# Patient Record
Sex: Female | Born: 1975 | Race: Black or African American | Hispanic: No | Marital: Single | Smoking: Never smoker
Health system: Southern US, Community
[De-identification: ages and names within clinical notes are randomized; demographics above are authoritative.]

## PROBLEM LIST (undated history)

## (undated) DIAGNOSIS — I499 Cardiac arrhythmia, unspecified: Secondary | ICD-10-CM

## (undated) HISTORY — PX: CARDIAC SURGERY: SHX584

## (undated) HISTORY — PX: MITRAL VALVE SURGERY: SHX714

---

## 2018-09-16 ENCOUNTER — Other Ambulatory Visit: Payer: Self-pay

## 2018-09-16 ENCOUNTER — Emergency Department (HOSPITAL_COMMUNITY)
Admission: EM | Admit: 2018-09-16 | Discharge: 2018-09-16 | Disposition: A | Discharge status: Home / Self Care | Payer: Medicaid Other | Attending: Emergency Medicine | Admitting: Emergency Medicine

## 2018-09-16 ENCOUNTER — Encounter (HOSPITAL_COMMUNITY): Payer: Self-pay

## 2018-09-16 DIAGNOSIS — S6991XA Unspecified injury of right wrist, hand and finger(s), initial encounter: Secondary | ICD-10-CM | POA: Diagnosis present

## 2018-09-16 DIAGNOSIS — Y939 Activity, unspecified: Secondary | ICD-10-CM | POA: Diagnosis not present

## 2018-09-16 DIAGNOSIS — X500XXA Overexertion from strenuous movement or load, initial encounter: Secondary | ICD-10-CM | POA: Insufficient documentation

## 2018-09-16 DIAGNOSIS — Y929 Unspecified place or not applicable: Secondary | ICD-10-CM | POA: Insufficient documentation

## 2018-09-16 DIAGNOSIS — Y990 Civilian activity done for income or pay: Secondary | ICD-10-CM | POA: Insufficient documentation

## 2018-09-16 DIAGNOSIS — S63501A Unspecified sprain of right wrist, initial encounter: Secondary | ICD-10-CM | POA: Diagnosis not present

## 2018-09-16 NOTE — ED Notes (Signed)
Wrist splint applied to patient's right wrist/wrist forearm split.

## 2018-09-16 NOTE — Discharge Instructions (Signed)
Take Ibuprofen 600mg  three times daily for the next 5 days Wear wrist brace while awake to provide support Follow up with orthopedics if you are not improving (Dr. Romeo Apple)

## 2018-09-16 NOTE — ED Provider Notes (Signed)
[facility]ERGENCY DEPARTMENT Provider Note   CSN: [id number] Arrival date & time: 09/16/18  1953     History   Chief Complaint Chief Complaint  Patient presents with  . Wrist Pain    right    HPI Kathleen Clay is a 43 y.o. female who presents with right wrist pain. No significant PMH. She states that she works at [employer] and was doing a new position at her job that included doing a lot of lifting, turning, and twisting of heavy metal with her right wrist. She is RHD. She states that shortly after she started to have severe pain and throbbing in her wrist and in to her thumb. It also radiates to her shoulder. A manager was notified and she didn't want to leave work so they wrapped up her wrist with coban which helped her throbbing but didn't help a lot with the pain. She has tingling of the thumb. She doesn't have any weakness of the hand. She has never had this before. She took 800 Ibuprofen prior to arrival.   HPI  History reviewed. No pertinent past medical history.  There are no active problems to display for this patient.    The histories are not reviewed yet. Please review them in the "History" navigator section and refresh this SmartLink.   OB History   No obstetric history on file.      Home Medications    Prior to Admission medications   Not on File    Family History No family history on file.  Social History Social History   Tobacco Use  . Smoking status: Never Smoker  . Smokeless tobacco: Never Used  Substance Use Topics  . Alcohol use: Not Currently  . Drug use: Never     Allergies   Penicillins   Review of Systems Review of Systems  Musculoskeletal: Positive for arthralgias. Negative for joint swelling.  Neurological: Positive for numbness. Negative for weakness.     Physical Exam Updated Vital Signs BP (!) 144/82 (BP Location: Left Arm)   Pulse 62   Temp 98.3 F (36.8 C) (Tympanic)   Resp 18   Ht 6\' 3"  (1.905 m)   Wt 121.1 kg    LMP 08/24/2018 (Approximate)   SpO2 90%   BMI 33.37 kg/m   Physical Exam Vitals signs and nursing note reviewed.  Constitutional:      General: She is not in acute distress.    Appearance: Normal appearance. She is well-developed.  HENT:     Head: Normocephalic and atraumatic.  Eyes:     General: No scleral icterus.       Right eye: No discharge.        Left eye: No discharge.     Conjunctiva/sclera: Conjunctivae normal.     Pupils: Pupils are equal, round, and reactive to light.  Neck:     Musculoskeletal: Normal range of motion.  Cardiovascular:     Rate and Rhythm: Normal rate.  Pulmonary:     Effort: Pulmonary effort is normal. No respiratory distress.  Abdominal:     General: There is no distension.  Musculoskeletal:     Comments: Right upper extremity: Pt is guarding wrist. No obvious swelling, deformity, or warmth of the shoulder, elbow, or wrist. Tenderness to palpation over anterior tendons of wrist, mostly over the lateral aspect. She is also  Decreased ROM of wrist. 2+ radial pulse. Subjective tingling of the thumb   Skin:    General: Skin is warm and  dry.  Neurological:     Mental Status: She is alert and oriented to person, place, and time.  Psychiatric:        Behavior: Behavior normal.      ED Treatments / Results  Labs (all labs ordered are listed, but only abnormal results are displayed) Labs Reviewed - No data to display  EKG None  Radiology No results found.  Procedures Procedures (including critical care time)  Medications Ordered in ED Medications - No data to display   Initial Impression / Assessment and Plan / ED Course  I have reviewed the triage vital signs and the nursing notes.  Pertinent labs & imaging results that were available during my care of the patient were reviewed by me and considered in my medical decision making (see chart for details).  43 year old female with atraumatic wrist pain which radiates to her shoulder  and associated tingling of the thumb.  She is diffusely tender but mostly over the tendons.  Exam is consistent with overuse injury.  We had a discussion on whether or not to obtain imaging.  I advised against this since she did not have a traumatic injury.  She had first wanted imaging to "make sure nothing was wrong" however after explained her that we would only be looking for a fracture with an x-ray she understood and declined imaging.  Advised to rest and use a brace.  Also advised her to use scheduled ibuprofen and apply heat to the affected areas.  She was given a referral to orthopedics for follow-up.  She was given a work note.  Final Clinical Impressions(s) / ED Diagnoses   Final diagnoses:  Sprain of right wrist, initial encounter    ED Discharge Orders    None       Beryle Quant 09/16/18 2138    Eber Hong, MD 09/17/18 [phone]

## 2018-09-16 NOTE — ED Triage Notes (Signed)
Pt reports right wrist pain after lifting/twisting parts at work today. Pt reports taking 800 mg ibuprofen and that helped with throbbing, but shooting pains now with prickly feeling in fingers. Pt has wrist wrapped with Coban, able to fit fingerwidth under wrap. Cap refill WNL.

## 2019-06-12 ENCOUNTER — Observation Stay (HOSPITAL_COMMUNITY)
Admission: EM | Admit: 2019-06-12 | Discharge: 2019-06-13 | Disposition: A | Discharge status: Home / Self Care | Payer: Medicaid Other | Attending: Internal Medicine | Admitting: Internal Medicine

## 2019-06-12 ENCOUNTER — Encounter (HOSPITAL_COMMUNITY)
Admission: EM | Disposition: A | Discharge status: Home / Self Care | Payer: Self-pay | Source: Home / Self Care | Attending: Emergency Medicine

## 2019-06-12 ENCOUNTER — Emergency Department (HOSPITAL_COMMUNITY): Payer: Medicaid Other

## 2019-06-12 ENCOUNTER — Other Ambulatory Visit: Payer: Self-pay

## 2019-06-12 DIAGNOSIS — Z23 Encounter for immunization: Secondary | ICD-10-CM | POA: Diagnosis not present

## 2019-06-12 DIAGNOSIS — S0230XA Fracture of orbital floor, unspecified side, initial encounter for closed fracture: Secondary | ICD-10-CM

## 2019-06-12 DIAGNOSIS — Z880 Allergy status to penicillin: Secondary | ICD-10-CM | POA: Insufficient documentation

## 2019-06-12 DIAGNOSIS — S0240EA Zygomatic fracture, right side, initial encounter for closed fracture: Secondary | ICD-10-CM | POA: Insufficient documentation

## 2019-06-12 DIAGNOSIS — S0240CA Maxillary fracture, right side, initial encounter for closed fracture: Secondary | ICD-10-CM

## 2019-06-12 DIAGNOSIS — S0531XA Ocular laceration without prolapse or loss of intraocular tissue, right eye, initial encounter: Secondary | ICD-10-CM | POA: Diagnosis not present

## 2019-06-12 DIAGNOSIS — H5461 Unqualified visual loss, right eye, normal vision left eye: Secondary | ICD-10-CM | POA: Diagnosis not present

## 2019-06-12 DIAGNOSIS — S0590XA Unspecified injury of unspecified eye and orbit, initial encounter: Secondary | ICD-10-CM | POA: Diagnosis present

## 2019-06-12 DIAGNOSIS — D649 Anemia, unspecified: Secondary | ICD-10-CM | POA: Diagnosis present

## 2019-06-12 DIAGNOSIS — Y92410 Unspecified street and highway as the place of occurrence of the external cause: Secondary | ICD-10-CM | POA: Insufficient documentation

## 2019-06-12 DIAGNOSIS — Z952 Presence of prosthetic heart valve: Secondary | ICD-10-CM | POA: Insufficient documentation

## 2019-06-12 DIAGNOSIS — Z79899 Other long term (current) drug therapy: Secondary | ICD-10-CM | POA: Insufficient documentation

## 2019-06-12 DIAGNOSIS — S01411A Laceration without foreign body of right cheek and temporomandibular area, initial encounter: Secondary | ICD-10-CM | POA: Insufficient documentation

## 2019-06-12 DIAGNOSIS — D62 Acute posthemorrhagic anemia: Secondary | ICD-10-CM | POA: Insufficient documentation

## 2019-06-12 DIAGNOSIS — I4891 Unspecified atrial fibrillation: Secondary | ICD-10-CM | POA: Insufficient documentation

## 2019-06-12 DIAGNOSIS — S62309A Unspecified fracture of unspecified metacarpal bone, initial encounter for closed fracture: Secondary | ICD-10-CM

## 2019-06-12 DIAGNOSIS — Z20828 Contact with and (suspected) exposure to other viral communicable diseases: Secondary | ICD-10-CM | POA: Insufficient documentation

## 2019-06-12 DIAGNOSIS — D509 Iron deficiency anemia, unspecified: Secondary | ICD-10-CM | POA: Diagnosis present

## 2019-06-12 HISTORY — DX: Cardiac arrhythmia, unspecified: I49.9

## 2019-06-12 HISTORY — PX: RUPTURED GLOBE EXPLORATION AND REPAIR: SHX2366

## 2019-06-12 LAB — CBC WITH DIFFERENTIAL/PLATELET
Abs Immature Granulocytes: 0.04 10*3/uL (ref 0.00–0.07)
Basophils Absolute: 0.1 10*3/uL (ref 0.0–0.1)
Basophils Relative: 1 %
Eosinophils Absolute: 0.2 10*3/uL (ref 0.0–0.5)
Eosinophils Relative: 2 %
HCT: 22.3 % — ABNORMAL LOW (ref 36.0–46.0)
Hemoglobin: 6.1 g/dL — CL (ref 12.0–15.0)
Immature Granulocytes: 1 %
Lymphocytes Relative: 19 %
Lymphs Abs: 1.6 10*3/uL (ref 0.7–4.0)
MCH: 14.7 pg — ABNORMAL LOW (ref 26.0–34.0)
MCHC: 27.4 g/dL — ABNORMAL LOW (ref 30.0–36.0)
MCV: 53.9 fL — ABNORMAL LOW (ref 80.0–100.0)
Monocytes Absolute: 0.7 10*3/uL (ref 0.1–1.0)
Monocytes Relative: 8 %
Neutro Abs: 6 10*3/uL (ref 1.7–7.7)
Neutrophils Relative %: 69 %
Platelets: 251 10*3/uL (ref 150–400)
RBC: 4.14 MIL/uL (ref 3.87–5.11)
RDW: 23.3 % — ABNORMAL HIGH (ref 11.5–15.5)
WBC: 8.6 10*3/uL (ref 4.0–10.5)
nRBC: 0 % (ref 0.0–0.2)

## 2019-06-12 LAB — BASIC METABOLIC PANEL
Anion gap: 12 (ref 5–15)
BUN: 11 mg/dL (ref 6–20)
CO2: 22 mmol/L (ref 22–32)
Calcium: 8.6 mg/dL — ABNORMAL LOW (ref 8.9–10.3)
Chloride: 103 mmol/L (ref 98–111)
Creatinine, Ser: 0.84 mg/dL (ref 0.44–1.00)
GFR calc Af Amer: >60 mL/min (ref >60)
GFR calc non Af Amer: >60 mL/min (ref >60)
Glucose, Bld: 127 mg/dL — ABNORMAL HIGH (ref 70–99)
Potassium: 3.9 mmol/L (ref 3.5–5.1)
Sodium: 137 mmol/L (ref 135–145)

## 2019-06-12 LAB — PROTIME-INR
INR: 1.3 — ABNORMAL HIGH (ref 0.8–1.2)
Prothrombin Time: 16.4 seconds — ABNORMAL HIGH (ref 11.4–15.2)

## 2019-06-12 LAB — I-STAT CHEM 8, ED
BUN: 14 mg/dL (ref 6–20)
Calcium, Ion: 1.1 mmol/L — ABNORMAL LOW (ref 1.15–1.40)
Chloride: 103 mmol/L (ref 98–111)
Creatinine, Ser: 0.7 mg/dL (ref 0.44–1.00)
Glucose, Bld: 125 mg/dL — ABNORMAL HIGH (ref 70–99)
HCT: 27 % — ABNORMAL LOW (ref 36.0–46.0)
Hemoglobin: 9.2 g/dL — ABNORMAL LOW (ref 12.0–15.0)
Potassium: 4 mmol/L (ref 3.5–5.1)
Sodium: 139 mmol/L (ref 135–145)
TCO2: 25 mmol/L (ref 22–32)

## 2019-06-12 LAB — CBC
HCT: 22 % — ABNORMAL LOW (ref 36.0–46.0)
Hemoglobin: 6.1 g/dL — CL (ref 12.0–15.0)
MCH: 14.8 pg — ABNORMAL LOW (ref 26.0–34.0)
MCHC: 27.7 g/dL — ABNORMAL LOW (ref 30.0–36.0)
MCV: 53.3 fL — ABNORMAL LOW (ref 80.0–100.0)
Platelets: 241 10*3/uL (ref 150–400)
RBC: 4.13 MIL/uL (ref 3.87–5.11)
RDW: 23.2 % — ABNORMAL HIGH (ref 11.5–15.5)
WBC: 8.5 10*3/uL (ref 4.0–10.5)
nRBC: 0 % (ref 0.0–0.2)

## 2019-06-12 LAB — I-STAT BETA HCG BLOOD, ED (MC, WL, AP ONLY): I-stat hCG, quantitative: <5 m[IU]/mL (ref <5)

## 2019-06-12 LAB — ETHANOL: Alcohol, Ethyl (B): <10 mg/dL (ref <10)

## 2019-06-12 SURGERY — REPAIR, RUPTURE, GLOBE
Anesthesia: General | Site: Eye | Laterality: Right

## 2019-06-12 MED ORDER — CEFAZOLIN SODIUM-DEXTROSE 1-4 GM/50ML-% IV SOLN
1.0000 g | Freq: Once | INTRAVENOUS | Status: AC
  Administered 2019-06-12: 23:00:00 1 g via INTRAVENOUS
  Filled 2019-06-12: qty 50

## 2019-06-12 MED ORDER — FENTANYL CITRATE (PF) 250 MCG/5ML IJ SOLN
INTRAMUSCULAR | Status: AC
  Filled 2019-06-12: qty 5

## 2019-06-12 MED ORDER — PROPOFOL 10 MG/ML IV BOLUS
INTRAVENOUS | Status: AC
  Filled 2019-06-12: qty 20

## 2019-06-12 MED ORDER — LIDOCAINE 2% (20 MG/ML) 5 ML SYRINGE
INTRAMUSCULAR | Status: AC
  Filled 2019-06-12: qty 5

## 2019-06-12 MED ORDER — SUCCINYLCHOLINE CHLORIDE 200 MG/10ML IV SOSY
PREFILLED_SYRINGE | INTRAVENOUS | Status: AC
  Filled 2019-06-12: qty 10

## 2019-06-12 MED ORDER — MORPHINE SULFATE (PF) 4 MG/ML IV SOLN
4.0000 mg | Freq: Once | INTRAVENOUS | Status: AC
  Administered 2019-06-12: 23:00:00 4 mg via INTRAVENOUS
  Filled 2019-06-12: qty 1

## 2019-06-12 MED ORDER — NA CHONDROIT SULF-NA HYALURON 40-30 MG/ML IO SOLN
INTRAOCULAR | Status: AC
  Filled 2019-06-12: qty 0.5

## 2019-06-12 MED ORDER — ROCURONIUM BROMIDE 10 MG/ML (PF) SYRINGE
PREFILLED_SYRINGE | INTRAVENOUS | Status: AC
  Filled 2019-06-12: qty 10

## 2019-06-12 MED ORDER — SODIUM HYALURONATE 10 MG/ML IO SOLN
INTRAOCULAR | Status: AC
  Filled 2019-06-12: qty 0.85

## 2019-06-12 MED ORDER — MIDAZOLAM HCL 2 MG/2ML IJ SOLN
INTRAMUSCULAR | Status: AC
  Filled 2019-06-12: qty 2

## 2019-06-12 MED ORDER — TETANUS-DIPHTH-ACELL PERTUSSIS 5-2.5-18.5 LF-MCG/0.5 IM SUSP
0.5000 mL | Freq: Once | INTRAMUSCULAR | Status: AC
  Administered 2019-06-12: 23:00:00 0.5 mL via INTRAMUSCULAR
  Filled 2019-06-12: qty 0.5

## 2019-06-12 MED ORDER — BSS IO SOLN
INTRAOCULAR | Status: AC
  Filled 2019-06-12: qty 30

## 2019-06-12 SURGICAL SUPPLY — 37 items
APPLICATOR COTTON TIP 6 STRL (MISCELLANEOUS) ×1 IMPLANT
APPLICATOR COTTON TIP 6IN STRL (MISCELLANEOUS) ×3
APPLICATOR DR MATTHEWS STRL (MISCELLANEOUS) ×3 IMPLANT
BLADE KERATOME 2.75 (BLADE) IMPLANT
BLADE KERATOME 2.75MM (BLADE)
BLADE SUPER 15 ALCON (BLADE) ×3 IMPLANT
CANNULA ANTERIOR CHAMBER 27GA (MISCELLANEOUS) IMPLANT
CAUTERY EYE LOW TEMP 1300F FIN (OPHTHALMIC RELATED) IMPLANT
CORD BIPOLAR FORCEPS 12FT (ELECTRODE) IMPLANT
COVER MAYO STAND STRL (DRAPES) ×3 IMPLANT
COVER SURGICAL LIGHT HANDLE (MISCELLANEOUS) ×3 IMPLANT
COVER WAND RF STERILE (DRAPES) IMPLANT
DRAPE OPHTHALMIC 40X48 W POUCH (DRAPES) ×3 IMPLANT
DRAPE RETRACTOR (MISCELLANEOUS) IMPLANT
FRAME EYE SHIELD (PROTECTIVE WEAR) ×3 IMPLANT
GLOVE BIO SURGEON STRL SZ7.5 (GLOVE) ×6 IMPLANT
GOWN STRL REUS W/ TWL LRG LVL3 (GOWN DISPOSABLE) ×2 IMPLANT
GOWN STRL REUS W/TWL LRG LVL3 (GOWN DISPOSABLE) ×4
KIT BASIN OR (CUSTOM PROCEDURE TRAY) ×3 IMPLANT
NEEDLE 18GX1X1/2 (RX/OR ONLY) (NEEDLE) ×3 IMPLANT
NEEDLE 25GX 5/8IN NON SAFETY (NEEDLE) ×3 IMPLANT
NEEDLE FILTER BLUNT 18X 1/2SAF (NEEDLE) ×2
NEEDLE FILTER BLUNT 18X1 1/2 (NEEDLE) ×1 IMPLANT
NS IRRIG 1000ML POUR BTL (IV SOLUTION) ×3 IMPLANT
PACK CATARACT CUSTOM (CUSTOM PROCEDURE TRAY) ×3 IMPLANT
PAD ARMBOARD 7.5X6 YLW CONV (MISCELLANEOUS) ×3 IMPLANT
PAK PIK CVS CATARACT (OPHTHALMIC) ×3 IMPLANT
SHIELD EYE LENSE ONLY DISP (GAUZE/BANDAGES/DRESSINGS) ×3 IMPLANT
SUT CHROMIC 6 0 TG140 8 (SUTURE) ×3 IMPLANT
SUT ETHILON 10 0 CS140 6 (SUTURE) IMPLANT
SUT ETHILON 8 0 TG100 8 (SUTURE) ×3 IMPLANT
SUT SILK 6 0 G 6 (SUTURE) IMPLANT
SUT VICRYL 8 0 TG140 8 (SUTURE) ×3 IMPLANT
SYR TB 1ML LUER SLIP (SYRINGE) ×3 IMPLANT
TIP ABS 45DEG FLARED 0.9MM (TIP) IMPLANT
WATER STERILE IRR 1000ML POUR (IV SOLUTION) ×3 IMPLANT
WIPE INSTRUMENT VISIWIPE 73X73 (MISCELLANEOUS) IMPLANT
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
## 2019-06-12 NOTE — ED Notes (Signed)
X-ray at bedside

## 2019-06-12 NOTE — ED Notes (Signed)
Called and spoke with pt's daughter about pt's surgery and condition. Answered all questions. Told her we will call her after pt's surgery is finished and give her an update
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
## 2019-06-12 NOTE — ED Notes (Signed)
Pt transported to CT ?

## 2019-06-12 NOTE — Op Note (Signed)
OPHTHALMOLOGY OPERATIVE NOTE  SURGEON: Julian Reil, M.D.   OPERATIVE EYE: Right eye  PRE OP DIAGNOSIS: Ruptured globe, right eye  POST OP DIAGNOSIS: SAME  PROCEDURE: Repair of ruptured globe, right eye  ANESTHESIA: General anesthesia  Blood loss: <5cc.   COMPLICATIONS:  NONE  DESCRIPTION OF PROCEDURE:  The patient was seen in the preoperative area where the surgical eye was marked. Review of procedure and poor prognosis discussed. Consent for repair of ruptured globe was obtained. The patient was brought back to the operating room. The eye was prepped and draped in the usual sterile fashion for surgery. The Surgeon sat superiorly. A lid speculum was placed.A 360 degree peritomy was performed. Uveal contents and what appeared to be the lens extruded through the wound. The eye was explored.  A large rupture was noted starting at 9 oclock and extending posteriorly. The lateral rectus and superior rectus could not be identified on the globe. They were assumed to be avulsed. The medial and inferior rectus were identified. Attempts were made to isolate the scleral laceration. An 8-0 nylon suture was used to partially close the scleral laceration in an interrupted fashion. However, the scleral laceration extended to far posteriorly for safe visualization. Due to the extent of the injuries and extrusion of intraocular contents, I decided to not further close the sclera. The conjunctiva was then sutured closed with an 8-0 vicryl. A subtenon injection of lidocaine with bupivicane was given. A subtenon injection of triamcinolone was given, approximately 40 mg.  The lid speculum was removed.Attention was turned to the cheek laceration. A 6-0 chromic suture was used to close the skin in running fashion. Good closure was achieved. The drapes were removed and the procedure was terminated. A patch was placed over the operative eye. The patient was extubated and taken to the PACU in good condition.

## 2019-06-12 NOTE — ED Notes (Signed)
Pt transported to xray 
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
## 2019-06-13 ENCOUNTER — Encounter (HOSPITAL_COMMUNITY): Payer: Self-pay | Admitting: Internal Medicine

## 2019-06-13 ENCOUNTER — Emergency Department (HOSPITAL_COMMUNITY): Payer: Medicaid Other | Admitting: Anesthesiology

## 2019-06-13 DIAGNOSIS — S0240EA Zygomatic fracture, right side, initial encounter for closed fracture: Secondary | ICD-10-CM | POA: Diagnosis not present

## 2019-06-13 DIAGNOSIS — S0240CB Maxillary fracture, right side, initial encounter for open fracture: Secondary | ICD-10-CM

## 2019-06-13 DIAGNOSIS — D649 Anemia, unspecified: Secondary | ICD-10-CM | POA: Diagnosis present

## 2019-06-13 DIAGNOSIS — D509 Iron deficiency anemia, unspecified: Secondary | ICD-10-CM | POA: Diagnosis not present

## 2019-06-13 DIAGNOSIS — S0531XA Ocular laceration without prolapse or loss of intraocular tissue, right eye, initial encounter: Secondary | ICD-10-CM | POA: Diagnosis not present

## 2019-06-13 DIAGNOSIS — S62309A Unspecified fracture of unspecified metacarpal bone, initial encounter for closed fracture: Secondary | ICD-10-CM

## 2019-06-13 DIAGNOSIS — S0240CA Maxillary fracture, right side, initial encounter for closed fracture: Secondary | ICD-10-CM

## 2019-06-13 DIAGNOSIS — Z952 Presence of prosthetic heart valve: Secondary | ICD-10-CM

## 2019-06-13 DIAGNOSIS — I4891 Unspecified atrial fibrillation: Secondary | ICD-10-CM

## 2019-06-13 DIAGNOSIS — H5461 Unqualified visual loss, right eye, normal vision left eye: Secondary | ICD-10-CM | POA: Diagnosis not present

## 2019-06-13 DIAGNOSIS — S0230XA Fracture of orbital floor, unspecified side, initial encounter for closed fracture: Secondary | ICD-10-CM

## 2019-06-13 LAB — CBC WITH DIFFERENTIAL/PLATELET
Abs Immature Granulocytes: 0.07 10*3/uL (ref 0.00–0.07)
Basophils Absolute: 0 10*3/uL (ref 0.0–0.1)
Basophils Relative: 0 %
Eosinophils Absolute: 0 10*3/uL (ref 0.0–0.5)
Eosinophils Relative: 0 %
HCT: 24.7 % — ABNORMAL LOW (ref 36.0–46.0)
Hemoglobin: 7.1 g/dL — ABNORMAL LOW (ref 12.0–15.0)
Immature Granulocytes: 1 %
Lymphocytes Relative: 3 %
Lymphs Abs: 0.4 10*3/uL — ABNORMAL LOW (ref 0.7–4.0)
MCH: 16.1 pg — ABNORMAL LOW (ref 26.0–34.0)
MCHC: 28.7 g/dL — ABNORMAL LOW (ref 30.0–36.0)
MCV: 56 fL — ABNORMAL LOW (ref 80.0–100.0)
Monocytes Absolute: 0.2 10*3/uL (ref 0.1–1.0)
Monocytes Relative: 2 %
Neutro Abs: 11.7 10*3/uL — ABNORMAL HIGH (ref 1.7–7.7)
Neutrophils Relative %: 94 %
Platelets: 252 10*3/uL (ref 150–400)
RBC: 4.41 MIL/uL (ref 3.87–5.11)
RDW: 26.7 % — ABNORMAL HIGH (ref 11.5–15.5)
WBC: 12.3 10*3/uL — ABNORMAL HIGH (ref 4.0–10.5)
nRBC: 0.2 % (ref 0.0–0.2)

## 2019-06-13 LAB — SARS CORONAVIRUS 2 BY RT PCR (HOSPITAL ORDER, PERFORMED IN ~~LOC~~ HOSPITAL LAB): SARS Coronavirus 2: NEGATIVE

## 2019-06-13 LAB — BASIC METABOLIC PANEL
Anion gap: 11 (ref 5–15)
BUN: 11 mg/dL (ref 6–20)
CO2: 23 mmol/L (ref 22–32)
Calcium: 9.1 mg/dL (ref 8.9–10.3)
Chloride: 101 mmol/L (ref 98–111)
Creatinine, Ser: 0.84 mg/dL (ref 0.44–1.00)
GFR calc Af Amer: >60 mL/min (ref >60)
GFR calc non Af Amer: >60 mL/min (ref >60)
Glucose, Bld: 144 mg/dL — ABNORMAL HIGH (ref 70–99)
Potassium: 4.8 mmol/L (ref 3.5–5.1)
Sodium: 135 mmol/L (ref 135–145)

## 2019-06-13 LAB — POCT I-STAT 4, (NA,K, GLUC, HGB,HCT)
Glucose, Bld: 135 mg/dL — ABNORMAL HIGH (ref 70–99)
HCT: 25 % — ABNORMAL LOW (ref 36.0–46.0)
Hemoglobin: 8.5 g/dL — ABNORMAL LOW (ref 12.0–15.0)
Potassium: 4 mmol/L (ref 3.5–5.1)
Sodium: 138 mmol/L (ref 135–145)

## 2019-06-13 LAB — ABO/RH: ABO/RH(D): A POS

## 2019-06-13 LAB — HIV ANTIBODY (ROUTINE TESTING W REFLEX): HIV Screen 4th Generation wRfx: NONREACTIVE

## 2019-06-13 LAB — PREPARE RBC (CROSSMATCH)

## 2019-06-13 MED ORDER — ONDANSETRON HCL 4 MG/2ML IJ SOLN: 4.0000 mg | Freq: Four times a day (QID) | INTRAMUSCULAR | Status: DC | PRN

## 2019-06-13 MED ORDER — METOPROLOL TARTRATE 12.5 MG HALF TABLET
12.5000 mg | ORAL_TABLET | Freq: Every day | ORAL | Status: DC
  Administered 2019-06-13: 09:00:00 12.5 mg via ORAL
  Filled 2019-06-13: qty 1

## 2019-06-13 MED ORDER — 0.9 % SODIUM CHLORIDE (POUR BTL) OPTIME
TOPICAL | Status: DC | PRN
  Administered 2019-06-13: 1000 mL

## 2019-06-13 MED ORDER — IBUPROFEN 200 MG PO TABS
400.0000 mg | ORAL_TABLET | Freq: Once | ORAL | Status: AC
  Administered 2019-06-13: 10:00:00 400 mg via ORAL
  Filled 2019-06-13: qty 2

## 2019-06-13 MED ORDER — OXYCODONE HCL 5 MG PO TABS: 5.0000 mg | ORAL_TABLET | Freq: Once | ORAL | Status: DC | PRN

## 2019-06-13 MED ORDER — LIDOCAINE HCL (PF) 4 % IJ SOLN
INTRAMUSCULAR | Status: AC
  Filled 2019-06-13: qty 5

## 2019-06-13 MED ORDER — PROVISC 10 MG/ML IO SOLN
INTRAOCULAR | Status: DC | PRN
  Administered 2019-06-13: 0.85 mL via INTRAOCULAR

## 2019-06-13 MED ORDER — EPHEDRINE 5 MG/ML INJ
INTRAVENOUS | Status: AC
  Filled 2019-06-13: qty 10

## 2019-06-13 MED ORDER — BUPIVACAINE HCL (PF) 0.75 % IJ SOLN
INTRAMUSCULAR | Status: AC
  Filled 2019-06-13: qty 10

## 2019-06-13 MED ORDER — BSS IO SOLN
INTRAOCULAR | Status: DC | PRN
  Administered 2019-06-13: 15 mL via INTRAOCULAR

## 2019-06-13 MED ORDER — TOBRAMYCIN 0.3 % OP OINT
TOPICAL_OINTMENT | OPHTHALMIC | Status: DC | PRN
  Administered 2019-06-13: 1 via OPHTHALMIC

## 2019-06-13 MED ORDER — EPHEDRINE SULFATE 50 MG/ML IJ SOLN
INTRAMUSCULAR | Status: DC | PRN
  Administered 2019-06-13 (×2): 10 mg via INTRAVENOUS

## 2019-06-13 MED ORDER — NA CHONDROIT SULF-NA HYALURON 40-30 MG/ML IO SOLN
INTRAOCULAR | Status: DC | PRN
  Administered 2019-06-13: 0.5 mL via INTRAOCULAR

## 2019-06-13 MED ORDER — OXYCODONE HCL 5 MG/5ML PO SOLN: 5.0000 mg | Freq: Once | ORAL | Status: DC | PRN

## 2019-06-13 MED ORDER — PROPOFOL 10 MG/ML IV BOLUS
INTRAVENOUS | Status: DC | PRN
  Administered 2019-06-13: 200 mg via INTRAVENOUS

## 2019-06-13 MED ORDER — OXYCODONE HCL 5 MG PO TABS
5.0000 mg | ORAL_TABLET | Freq: Once | ORAL | Status: AC
  Administered 2019-06-13: 10:00:00 5 mg via ORAL
  Filled 2019-06-13: qty 1

## 2019-06-13 MED ORDER — FERROUS GLUCONATE 324 (38 FE) MG PO TABS: 324.0000 mg | ORAL_TABLET | Freq: Every day | ORAL | 3 refills | Status: AC

## 2019-06-13 MED ORDER — LIDOCAINE HCL (CARDIAC) PF 100 MG/5ML IV SOSY
PREFILLED_SYRINGE | INTRAVENOUS | Status: DC | PRN
  Administered 2019-06-13: 100 mg via INTRATRACHEAL

## 2019-06-13 MED ORDER — IBUPROFEN 200 MG PO TABS: 400.0000 mg | ORAL_TABLET | Freq: Four times a day (QID) | ORAL | 2 refills | Status: AC | PRN

## 2019-06-13 MED ORDER — SODIUM CHLORIDE 0.9 % IV SOLN: 10.0000 mL/h | Freq: Once | INTRAVENOUS | Status: DC

## 2019-06-13 MED ORDER — SUGAMMADEX SODIUM 500 MG/5ML IV SOLN
INTRAVENOUS | Status: DC | PRN
  Administered 2019-06-13: 300 mg via INTRAVENOUS

## 2019-06-13 MED ORDER — MORPHINE SULFATE (PF) 4 MG/ML IV SOLN: 4.0000 mg | Freq: Once | INTRAVENOUS | Status: DC

## 2019-06-13 MED ORDER — SENNOSIDES-DOCUSATE SODIUM 8.6-50 MG PO TABS: 2.0000 | ORAL_TABLET | Freq: Every day | ORAL | 1 refills | Status: AC | PRN

## 2019-06-13 MED ORDER — ATROPINE SULFATE 1 % OP SOLN
OPHTHALMIC | Status: AC
  Filled 2019-06-13: qty 5

## 2019-06-13 MED ORDER — ROCURONIUM 10MG/ML (10ML) SYRINGE FOR MEDFUSION PUMP - OPTIME
INTRAVENOUS | Status: DC | PRN
  Administered 2019-06-13: 100 mg via INTRAVENOUS

## 2019-06-13 MED ORDER — TOBRAMYCIN-DEXAMETHASONE 0.3-0.1 % OP OINT
TOPICAL_OINTMENT | OPHTHALMIC | Status: AC
  Filled 2019-06-13: qty 3.5

## 2019-06-13 MED ORDER — LIDOCAINE HCL (PF) 4 % IJ SOLN
INTRAMUSCULAR | Status: DC | PRN
  Administered 2019-06-13: 7 mL via OPHTHALMIC

## 2019-06-13 MED ORDER — POLYETHYLENE GLYCOL 3350 17 G PO PACK: 17.0000 g | PACK | Freq: Every day | ORAL | 0 refills | Status: AC

## 2019-06-13 MED ORDER — ACETAMINOPHEN 650 MG RE SUPP: 650.0000 mg | Freq: Four times a day (QID) | RECTAL | Status: DC | PRN

## 2019-06-13 MED ORDER — SUGAMMADEX SODIUM 500 MG/5ML IV SOLN
INTRAVENOUS | Status: AC
  Filled 2019-06-13: qty 5

## 2019-06-13 MED ORDER — BISACODYL 10 MG RE SUPP: 10.0000 mg | RECTAL | 0 refills | Status: AC | PRN

## 2019-06-13 MED ORDER — MORPHINE SULFATE (PF) 2 MG/ML IV SOLN
1.0000 mg | INTRAVENOUS | Status: DC | PRN
  Administered 2019-06-13 (×2): 1 mg via INTRAVENOUS
  Filled 2019-06-13 (×2): qty 1

## 2019-06-13 MED ORDER — DULCOLAX 5 MG PO TBEC: 5.0000 mg | DELAYED_RELEASE_TABLET | Freq: Every day | ORAL | 1 refills | Status: AC | PRN

## 2019-06-13 MED ORDER — PROMETHAZINE HCL 25 MG/ML IJ SOLN: 6.2500 mg | INTRAMUSCULAR | Status: DC | PRN

## 2019-06-13 MED ORDER — ONDANSETRON HCL 4 MG PO TABS: 4.0000 mg | ORAL_TABLET | Freq: Four times a day (QID) | ORAL | Status: DC | PRN

## 2019-06-13 MED ORDER — DEXAMETHASONE SODIUM PHOSPHATE 10 MG/ML IJ SOLN
INTRAMUSCULAR | Status: DC | PRN
  Administered 2019-06-13: 10 mg via INTRAVENOUS

## 2019-06-13 MED ORDER — LACTATED RINGERS IV SOLN
INTRAVENOUS | Status: DC | PRN
  Administered 2019-06-13: 01:00:00 via INTRAVENOUS

## 2019-06-13 MED ORDER — OXYCODONE HCL 5 MG PO TABS: 5.0000 mg | ORAL_TABLET | ORAL | 0 refills | Status: AC | PRN

## 2019-06-13 MED ORDER — MIDAZOLAM HCL 2 MG/2ML IJ SOLN
INTRAMUSCULAR | Status: DC | PRN
  Administered 2019-06-13: 2 mg via INTRAVENOUS

## 2019-06-13 MED ORDER — TRIAMCINOLONE ACETONIDE 40 MG/ML IJ SUSP
INTRAMUSCULAR | Status: DC | PRN
  Administered 2019-06-13: 40 mg via INTRAMUSCULAR

## 2019-06-13 MED ORDER — FENTANYL CITRATE (PF) 250 MCG/5ML IJ SOLN
INTRAMUSCULAR | Status: DC | PRN
  Administered 2019-06-13: 150 ug via INTRAVENOUS
  Administered 2019-06-13: 50 ug via INTRAVENOUS

## 2019-06-13 MED ORDER — ACETAMINOPHEN 10 MG/ML IV SOLN
1000.0000 mg | Freq: Once | INTRAVENOUS | Status: DC | PRN
  Administered 2019-06-13: 04:00:00 1000 mg via INTRAVENOUS

## 2019-06-13 MED ORDER — ONDANSETRON HCL 4 MG/2ML IJ SOLN
INTRAMUSCULAR | Status: DC | PRN
  Administered 2019-06-13: 4 mg via INTRAVENOUS

## 2019-06-13 MED ORDER — ACETAMINOPHEN 10 MG/ML IV SOLN
INTRAVENOUS | Status: AC
  Filled 2019-06-13: qty 100

## 2019-06-13 MED ORDER — NALOXONE HCL 0.4 MG/ML IJ SOLN
INTRAMUSCULAR | Status: AC
  Filled 2019-06-13: qty 1

## 2019-06-13 MED ORDER — SODIUM CHLORIDE 0.9 % IV SOLN
INTRAVENOUS | Status: DC | PRN
  Administered 2019-06-13: 02:00:00 via INTRAVENOUS

## 2019-06-13 MED ORDER — POLYETHYLENE GLYCOL 3350 17 G PO PACK
17.0000 g | PACK | Freq: Every day | ORAL | Status: DC
  Administered 2019-06-13: 10:00:00 17 g via ORAL
  Filled 2019-06-13: qty 1

## 2019-06-13 MED ORDER — FENTANYL CITRATE (PF) 100 MCG/2ML IJ SOLN: 25.0000 ug | INTRAMUSCULAR | Status: DC | PRN

## 2019-06-13 MED ORDER — NALOXONE HCL 0.4 MG/ML IJ SOLN
INTRAMUSCULAR | Status: DC | PRN
  Administered 2019-06-13 (×2): 20 ug via INTRAVENOUS

## 2019-06-13 MED ORDER — ACETAMINOPHEN 325 MG PO TABS
650.0000 mg | ORAL_TABLET | Freq: Four times a day (QID) | ORAL | Status: DC | PRN
  Administered 2019-06-13: 650 mg via ORAL
  Filled 2019-06-13 (×2): qty 2

## 2019-06-13 NOTE — Progress Notes (Signed)
Orthopedic Tech Progress Note Patient Details:  Kathleen Clay 1975/12/26 [id number]  Ortho Devices Type of Ortho Device: Ulna gutter splint Ortho Device/Splint Location: URE Ortho Device/Splint Interventions: Adjustment, Application, Ordered   Post Interventions Patient Tolerated: Well Instructions Provided: Care of device, Adjustment of device   [name] 06/13/2019, 11:51 AM
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
## 2019-06-13 NOTE — Anesthesia Preprocedure Evaluation (Addendum)
Anesthesia Evaluation  Patient identified by MRN, date of birth, ID band Patient awake    Reviewed: Allergy & Precautions, NPO status , Patient's Chart, lab work & pertinent test results, reviewed documented beta blocker date and time   History of Anesthesia Complications Negative for: history of anesthetic complications  Airway Mallampati: II  TM Distance: >3 FB Neck ROM: Full    Dental  (+) Dental Advisory Given, Teeth Intact   Pulmonary neg pulmonary ROS,    Pulmonary exam normal        Cardiovascular Normal cardiovascular exam+ dysrhythmias Atrial Fibrillation    S/p mitral valve replacement surgery for hx rheumatic fever     Neuro/Psych negative neurological ROS  negative psych ROS   GI/Hepatic negative GI ROS, Neg liver ROS,   Endo/Other   Obesity   Renal/GU negative Renal ROS     Musculoskeletal negative musculoskeletal ROS (+)   Abdominal   Peds  Hematology  (+) anemia ,   Anesthesia Other Findings   Reproductive/Obstetrics                            Anesthesia Physical Anesthesia Plan  ASA: III and emergent  Anesthesia Plan: General   Post-op Pain Management:    Induction: Intravenous and Rapid sequence  PONV Risk Score and Plan: 3 and Treatment may vary due to age or medical condition, Ondansetron, Dexamethasone and Midazolam  Airway Management Planned: Oral ETT  Additional Equipment: None  Intra-op Plan:   Post-operative Plan: Extubation in OR  Informed Consent: I have reviewed the patients History and Physical, chart, labs and discussed the procedure including the risks, benefits and alternatives for the proposed anesthesia with the patient or authorized representative who has indicated his/her understanding and acceptance.     Dental advisory given  Plan Discussed with: CRNA and Anesthesiologist  Anesthesia Plan Comments:        Anesthesia Quick  Evaluation

## 2019-06-13 NOTE — ED Notes (Signed)
Spoke with ED MD and CRNA about pt's low HGB. CRNA stated she would speak with anaesthesilogy and get back

## 2019-06-13 NOTE — Progress Notes (Signed)
Patient is in pain.  Eye patch in place.  Is supposed to visit eye doctor at [place] for follow up
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
## 2019-06-13 NOTE — Anesthesia Postprocedure Evaluation (Signed)
Anesthesia Post Note  Patient: Kathleen Clay  Procedure(s) Performed: REPAIR OF RUPTURED GLOBE (Right Eye)     Patient location during evaluation: PACU Anesthesia Type: General Level of consciousness: awake and alert Pain management: pain level controlled Vital Signs Assessment: post-procedure vital signs reviewed and stable Respiratory status: spontaneous breathing, nonlabored ventilation, respiratory function stable and patient connected to nasal cannula oxygen Cardiovascular status: blood pressure returned to baseline and stable Postop Assessment: no apparent nausea or vomiting Anesthetic complications: no    Last Vitals:  Vitals:   06/13/19 0740 06/13/19 0845  BP: 121/79   Pulse: 73 80  Resp: 20   Temp: 36.8 C   SpO2: 100%     Last Pain:  Vitals:   06/13/19 0740  TempSrc: Oral  PainSc:                  [name]

## 2019-06-13 NOTE — Anesthesia Procedure Notes (Signed)
Procedure Name: Intubation Date/Time: 06/13/2019 1:23 AM Performed by: Valetta Fuller, CRNA Pre-anesthesia Checklist: Patient identified, Emergency Drugs available, Suction available and Patient being monitored Patient Re-evaluated:Patient Re-evaluated prior to induction Oxygen Delivery Method: Circle system utilized Preoxygenation: Pre-oxygenation with 100% oxygen Induction Type: IV induction Ventilation: Mask ventilation without difficulty and Oral airway inserted - appropriate to patient size Laryngoscope Size: Sabra Heck and 2 Grade View: Grade I Tube type: Oral Tube size: 7.5 mm Number of attempts: 1 Airway Equipment and Method: Stylet Placement Confirmation: ETT inserted through vocal cords under direct vision,  positive ETCO2 and breath sounds checked- equal and bilateral Secured at: 21 cm Tube secured with: Tape Dental Injury: Teeth and Oropharynx as per pre-operative assessment

## 2019-06-13 NOTE — Transfer of Care (Signed)
Immediate Anesthesia Transfer of Care Note  Patient: Makensey Rego  Procedure(s) Performed: REPAIR OF RUPTURED GLOBE (Right Eye)  Patient Location: PACU  Anesthesia Type:General  Level of Consciousness: awake and alert   Airway & Oxygen Therapy: Patient Spontanous Breathing  Post-op Assessment: Report given to RN and Post -op Vital signs reviewed and stable  Post vital signs: Reviewed and stable  Last Vitals:  Vitals Value Taken Time  BP 134/93 06/13/19 0331  Temp    Pulse 96 06/13/19 0332  Resp 24 06/13/19 0333  SpO2 92 % 06/13/19 0332  Vitals shown include unvalidated device data.  Last Pain:  Vitals:   06/12/19 2253  TempSrc:   PainSc: 9          Complications: No apparent anesthesia complications

## 2019-06-14 LAB — TYPE AND SCREEN
ABO/RH(D): A POS
Antibody Screen: NEGATIVE
Unit division: 0

## 2019-06-14 LAB — BPAM RBC
Blood Product Expiration Date: 202010242359
ISSUE DATE / TIME: 202010050103
Unit Type and Rh: 6200

## 2019-10-03 ENCOUNTER — Encounter: Payer: Self-pay | Admitting: *Deleted

## 2019-10-04 ENCOUNTER — Ambulatory Visit: Payer: Medicaid Other | Admitting: Cardiology

## 2020-01-02 ENCOUNTER — Encounter (HOSPITAL_COMMUNITY)
Admission: RE | Admit: 2020-01-02 | Discharge: 2020-01-02 | Disposition: A | Discharge status: Home / Self Care | Payer: Medicaid Other | Source: Ambulatory Visit | Attending: *Deleted | Admitting: *Deleted

## 2020-01-02 ENCOUNTER — Other Ambulatory Visit: Payer: Self-pay

## 2020-01-02 NOTE — Progress Notes (Signed)
Cardiac/Pulmonary Rehab Medication Review by a Pharmacist  Does the patient  feel that his/her medications are working for him/her?  yes  Has the patient been experiencing any side effects to the medications prescribed?  yes  Does the patient measure his/her own blood pressure or blood glucose at home?  no   Does the patient have any problems obtaining medications due to transportation or finances?   no  Understanding of regimen: good Understanding of indications: good Potential of compliance: good  Questions asked to Determine Patient Understanding of Medication Regimen:  1. What is the name of the medication?  2. What is the medication used for?  3. When should it be taken?  4. How much should be taken?  5. How will you take it?  6. What side effects should you report?  Understanding Defined as: Excellent: All questions above are correct Good: Questions 1-4 are correct Fair: Questions 1-2 are correct  Poor: 1 or none of the above questions are correct   Pharmacist comments:   Patient's med list was updated to include the following medications:  Plavix 75mg  daily Furosemide  bid Spironolactone daily Meloxicam daily Aspirin 81mg  daily  Patient was unsure of doses of furosemide, meloxicam and spironolactone.    The following medications were removed from patient's medication list: Bisacodyl 5mg  tablets Bisacodyl 10mg  rectal suppository Ibuprofen 200mg  tablet Senokot-S tablet Miralax 17g packet  In addition, patient was shown how to take her pulse, as she's on a beta blocker, albeit a low-dose one.  Thank You, 01/02/2020 2:16 PM
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
